# Patient Record
Sex: Female | Born: 1995 | Race: Asian | Hispanic: No | Marital: Single | Smoking: Never smoker
Health system: Southern US, Community
[De-identification: ages and names within clinical notes are randomized; demographics above are authoritative.]

## PROBLEM LIST (undated history)

## (undated) HISTORY — PX: MOUTH SURGERY: SHX715

---
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
## 2019-02-27 ENCOUNTER — Other Ambulatory Visit: Payer: Self-pay

## 2019-02-27 DIAGNOSIS — Z20822 Contact with and (suspected) exposure to covid-19: Secondary | ICD-10-CM

## 2019-03-02 LAB — NOVEL CORONAVIRUS, NAA: SARS-CoV-2, NAA: NOT DETECTED

## 2020-10-17 ENCOUNTER — Emergency Department (HOSPITAL_COMMUNITY)
Admission: EM | Admit: 2020-10-17 | Discharge: 2020-10-17 | Disposition: A | Discharge status: Home / Self Care | Payer: 59 | Attending: Emergency Medicine | Admitting: Emergency Medicine

## 2020-10-17 ENCOUNTER — Other Ambulatory Visit: Payer: Self-pay

## 2020-10-17 DIAGNOSIS — T782XXA Anaphylactic shock, unspecified, initial encounter: Secondary | ICD-10-CM | POA: Diagnosis not present

## 2020-10-17 DIAGNOSIS — R1310 Dysphagia, unspecified: Secondary | ICD-10-CM | POA: Diagnosis present

## 2020-10-17 DIAGNOSIS — T63461A Toxic effect of venom of wasps, accidental (unintentional), initial encounter: Secondary | ICD-10-CM | POA: Diagnosis not present

## 2020-10-17 DIAGNOSIS — R000 Tachycardia, unspecified: Secondary | ICD-10-CM | POA: Insufficient documentation

## 2020-10-17 LAB — I-STAT BETA HCG BLOOD, ED (MC, WL, AP ONLY): I-stat hCG, quantitative: <5 m[IU]/mL (ref <5)

## 2020-10-17 MED ORDER — SODIUM CHLORIDE 0.9 % IV BOLUS
1000.0000 mL | Freq: Once | INTRAVENOUS | Status: AC
  Administered 2020-10-17: 1000 mL via INTRAVENOUS

## 2020-10-17 MED ORDER — EPINEPHRINE 0.3 MG/0.3ML IJ SOAJ
0.3000 mg | Freq: Once | INTRAMUSCULAR | Status: AC
  Administered 2020-10-17: 0.3 mg via INTRAMUSCULAR
  Filled 2020-10-17: qty 0.3

## 2020-10-17 MED ORDER — DIPHENHYDRAMINE HCL 50 MG/ML IJ SOLN
50.0000 mg | Freq: Once | INTRAMUSCULAR | Status: AC
  Administered 2020-10-17: 50 mg via INTRAVENOUS
  Filled 2020-10-17: qty 1

## 2020-10-17 MED ORDER — FAMOTIDINE IN NACL 20-0.9 MG/50ML-% IV SOLN
20.0000 mg | Freq: Once | INTRAVENOUS | Status: AC
  Administered 2020-10-17: 20 mg via INTRAVENOUS
  Filled 2020-10-17: qty 50

## 2020-10-17 MED ORDER — EPINEPHRINE 0.3 MG/0.3ML IJ SOAJ: 0.3000 mg | INTRAMUSCULAR | 0 refills | Status: AC | PRN

## 2020-10-17 MED ORDER — METHYLPREDNISOLONE SODIUM SUCC 125 MG IJ SOLR
125.0000 mg | Freq: Once | INTRAMUSCULAR | Status: AC
  Administered 2020-10-17: 125 mg via INTRAVENOUS
  Filled 2020-10-17: qty 2

## 2020-10-17 MED ORDER — SODIUM CHLORIDE 0.9 % IV SOLN
INTRAVENOUS | Status: DC
Start: 2020-10-17 — End: 2020-10-17

## 2020-10-17 MED ORDER — PREDNISONE 50 MG PO TABS: 50.0000 mg | ORAL_TABLET | Freq: Every day | ORAL | 0 refills | Status: AC

## 2020-10-17 NOTE — ED Notes (Signed)
RN reviewed discharge instructions w/ pt. Follow up and prescriptions reviewed, epi pen instructions reviewed. Pt had no further questions, ambulated independently to lobby

## 2020-10-17 NOTE — ED Provider Notes (Signed)
[facility] EMERGENCY DEPARTMENT Provider Note   CSN: [id number] Arrival date & time: 10/17/20  5784     History Chief Complaint  Patient presents with   Allergic Reaction    Tonya Jimenez is a 25 y.o. female.  The history is provided by the patient and medical records. No language interpreter was used.  Allergic Reaction Presenting symptoms: difficulty swallowing, itching, rash and swelling   Presenting symptoms: no difficulty breathing and no wheezing   Severity:  Severe Duration:  4 hours Context: insect bite/sting   Relieved by:  Nothing Worsened by:  Nothing Ineffective treatments:  None tried     No past medical history on file.  There are no problems to display for this patient.   Past Surgical History:  Procedure Laterality Date   MOUTH SURGERY       OB History   No obstetric history on file.     No family history on file.  Social History   Tobacco Use   Smoking status: Never  Substance Use Topics   Alcohol use: No   Drug use: No    Home Medications Prior to Admission medications   Medication Sig Start Date End Date Taking? Authorizing Provider  cyclobenzaprine (FLEXERIL) 10 MG tablet Take 1 tablet (10 mg total) by mouth 2 (two) times daily as needed for muscle spasms. 09/08/14   Elpidio Anis, PA-C  ibuprofen (ADVIL,MOTRIN) 800 MG tablet Take 1 tablet (800 mg total) by mouth 3 (three) times daily. 09/08/14   Elpidio Anis, PA-C    Allergies    Patient has no known allergies.  Review of Systems   Review of Systems  Constitutional:  Negative for chills, diaphoresis, fatigue and fever.  HENT:  Positive for facial swelling and trouble swallowing. Negative for congestion.   Eyes:  Negative for visual disturbance.  Respiratory:  Positive for chest tightness. Negative for cough, shortness of breath and wheezing.   Cardiovascular:  Positive for chest pain and palpitations. Negative for leg swelling.  Gastrointestinal:   Negative for abdominal pain, constipation, diarrhea, nausea and vomiting.  Genitourinary:  Negative for dysuria.  Musculoskeletal:  Negative for back pain, neck pain and neck stiffness.  Skin:  Positive for itching and rash. Negative for wound.  Neurological:  Positive for light-headedness. Negative for dizziness, weakness and headaches.  Psychiatric/Behavioral:  Negative for agitation and confusion.   All other systems reviewed and are negative.  Physical Exam Updated Vital Signs BP (!) 100/58   Pulse 92   Temp 98.9 F (37.2 C) (Oral)   Resp 18   SpO2 98%   Physical Exam Vitals and nursing note reviewed.  Constitutional:      General: She is not in acute distress.    Appearance: She is well-developed. She is ill-appearing. She is not toxic-appearing or diaphoretic.  HENT:     Head: Atraumatic.     Comments: Diffuse facial swelling including the eyelids bilaterally.  Sensation of throat swelling reported.  No stridor appreciated.    Nose: Nose normal. No congestion or rhinorrhea.     Mouth/Throat:     Mouth: Mucous membranes are moist.     Pharynx: No oropharyngeal exudate or posterior oropharyngeal erythema.  Eyes:     Extraocular Movements: Extraocular movements intact.     Conjunctiva/sclera: Conjunctivae normal.     Pupils: Pupils are equal, round, and reactive to light.  Cardiovascular:     Rate and Rhythm: Regular rhythm. Tachycardia present.     Heart  sounds: No murmur heard. Pulmonary:     Effort: Pulmonary effort is normal. No respiratory distress.     Breath sounds: Normal breath sounds.  Abdominal:     Palpations: Abdomen is soft.     Tenderness: There is no abdominal tenderness. There is no right CVA tenderness, left CVA tenderness, guarding or rebound.  Musculoskeletal:        General: No tenderness.     Cervical back: Neck supple. No tenderness.  Skin:    General: Skin is warm and dry.     Findings: Erythema and rash present.  Neurological:     General:  No focal deficit present.     Mental Status: She is alert.     Sensory: No sensory deficit.    ED Results / Procedures / Treatments   Labs (all labs ordered are listed, but only abnormal results are displayed) Labs Reviewed  I-STAT BETA HCG BLOOD, ED ([facility], [facility], [facility] ONLY)    EKG None  Radiology No results found.  Procedures Procedures   CRITICAL CARE Performed by: Canary Brim Maheen Cwikla Total critical care time: 35 minutes Critical care time was exclusive of separately billable procedures and treating other patients. Critical care was necessary to treat or prevent imminent or life-threatening deterioration. Critical care was time spent personally by me on the following activities: development of treatment plan with patient and/or surrogate as well as nursing, discussions with consultants, evaluation of patient's response to treatment, examination of patient, obtaining history from patient or surrogate, ordering and performing treatments and interventions, ordering and review of laboratory studies, ordering and review of radiographic studies, pulse oximetry and re-evaluation of patient's condition.  Medications Ordered in ED Medications  sodium chloride 0.9 % bolus 1,000 mL (0 mLs Intravenous Stopped 10/17/20 1243)    And  0.9 %  sodium chloride infusion ( Intravenous New Bag/Given 10/17/20 1259)  diphenhydrAMINE (BENADRYL) injection 50 mg (50 mg Intravenous Given 10/17/20 1017)  EPINEPHrine (EPI-PEN) injection 0.3 mg (0.3 mg Intramuscular Given 10/17/20 1018)  methylPREDNISolone sodium succinate (SOLU-MEDROL) 125 mg/2 mL injection 125 mg (125 mg Intravenous Given 10/17/20 1017)  famotidine (PEPCID) IVPB 20 mg premix (0 mg Intravenous Stopped 10/17/20 1242)    ED Course  I have reviewed the triage vital signs and the nursing notes.  Pertinent labs & imaging results that were available during my care of the patient were reviewed by me and considered in my medical decision making (see  chart for details).    MDM Rules/Calculators/A&P                           Tonya Jimenez is a 25 y.o. female with no significant past medical history who presents with anaphylactic reaction.  According to patient, last week she was stung by yellow jacket on her left upper chest.  She had a local reaction but did not have any systemic symptoms.  She reports that this morning, patient saw a blackhead where the sting took place but after trying to pop it and pull out the black area, it was a part of the stinger.  Shortly thereafter, patient started having anaphylactic reaction.  She reports chest tightness, chest pain, throat tightening, diffuse rash, pruritus, and lightheadedness.  She reports that she went to urgent care this morning and was sent immediately here for anaphylactic reaction.  She has never had a reaction like this before.  Denies any other complaints.  Patient seen quickly in triage where  she was determined to have anaphylactic reaction.  She had antihistamines ordered, steroids, and epinephrine for airway protection.  Patient also get some fluids for feeling lightheaded.  I saw patient just before getting the medications and she indeed was having a diffuse anaphylactic reaction.  She had swelling of her face, eyelids, and sensation of her mouth swelling.  I did not appreciate stridor on my auscultation nor significant wheezing.  She had diffuse rash and pruritus.  She was slightly tachycardic and had soft pressures although she was not hypoxic.  She was 9 difficulty breathing at this time.  After epinephrine and steroids and histamines, symptoms drastically improved.  She had no further sensation of abnormality in her mouth or throat and the swelling was improving.  Rash nearly resolved and itching had improved.  Patient be observed for several hours given the severity of her reaction to make sure the epinephrine does not wear off with recurrent reaction.  If she is observed for  several hours and has no worsening of symptoms and tolerates a p.o. challenge, dissipate discharge home with a burst of steroids, instructions to take Benadryl, and an epinephrine prescription.  Patient is amenable this plan, dissipate reassessment after monitoring and p.o. challenge.  Patient felt better after monitoring.  She was discharged home with resolution of symptoms and with prescription for steroids and EpiPen.  Final Clinical Impression(s) / ED Diagnoses Final diagnoses:  None    Rx / DC Orders ED Discharge Orders          Ordered    predniSONE (DELTASONE) 50 MG tablet  Daily        10/17/20 1449    EPINEPHrine 0.3 mg/0.3 mL IJ SOAJ injection  As needed        10/17/20 1449            Clinical Impression: 1. Anaphylaxis, initial encounter   2. Yellow jacket sting, accidental or unintentional, initial encounter     Disposition: Discharge  Condition: Good  I have discussed the results, Dx and Tx plan with the pt(& family if present). He/she/they expressed understanding and agree(s) with the plan. Discharge instructions discussed at great length. Strict return precautions discussed and pt &/or family have verbalized understanding of the instructions. No further questions at time of discharge.    Discharge Medication List as of 10/17/2020  2:49 PM     START taking these medications   Details  EPINEPHrine 0.3 mg/0.3 mL IJ SOAJ injection Inject 0.3 mg into the muscle as needed for anaphylaxis., Starting Sat 10/17/2020, Print    predniSONE (DELTASONE) 50 MG tablet Take 1 tablet (50 mg total) by mouth daily for 5 days., Starting Sun 10/18/2020, Until Fri 10/23/2020, Print        Follow Up: [facility] [address] [place] [address] [phone] Schedule an appointment as soon as possible for a visit    [facility] EMERGENCY DEPARTMENT [address] [id number] m[place] [address] [phone]       Castella Lerner, Canary Brim, MD 10/17/20 [phone]

## 2020-10-17 NOTE — ED Triage Notes (Signed)
Pt stung by yellow jacket last Thursday (7/14) and yesterday (7/22) pt developed throat tightening, itching, rash, and chest pain. Went to UC for eval this morning and was sent here for further eval. Took bendadryl and ASA PTA. Denies previous rxn to being stung.

## 2020-10-17 NOTE — ED Provider Notes (Signed)
Emergency Medicine Provider Triage Evaluation Note  Tonya Jimenez , a 25 y.o. female  was evaluated in triage.  Pt complains of allergic reaction.  Patient states she was stung last week and again the night before last.  Yesterday she developed hives and itching.  Symptoms continued to progress and she developed swelling around the eyes, throat discomfort, and intermittent chest tightness.  She is self medicated with Benadryl, most recently at 545 this morning, approximately 4 hours PTA.  She continues to have itching, swelling, and chest discomfort.  Throat symptoms have resolved.  She does have difficulty swallowing liquids due to her symptoms.  No previous history of allergic reaction.  No other medical problems.  No sob, mouth swelling  Review of Systems  Positive: Itching, swelling, chest discomfort Negative: Mouth swelling, sob  Physical Exam  BP 132/86   Pulse (!) 110   Temp 98.9 F (37.2 C) (Oral)   Resp 18   SpO2 100%  Gen:   Awake, no distress   Resp:  Normal effort. OP clear MSK:   Moves extremities without difficulty  Other:  Diffuse hives noted.  Periorbital swelling bilaterally.  Speaking in full sentences.  Clear lung sounds.  Handling secretions.  Patient is tachycardic at 110.  Medical Decision Making  Medically screening exam initiated at 9:36 AM.  Appropriate orders placed.  Tonya Jimenez was informed that the remainder of the evaluation will be completed by another provider, this initial triage assessment does not replace that evaluation, and the importance of remaining in the ED until their evaluation is complete.  With with allergic reaction affecting 2+ systems. Will tx for anaphylaxis.   Tonya Apley, PA-C 10/17/20 0945    Jimenez, Tonya Brim, MD 10/17/20 [phone]

## 2020-10-17 NOTE — Discharge Instructions (Addendum)
Your history, exam, work-up today are consistent with anaphylactic reaction related to the bee sting.  Please stay away from yellow jackets and please keep the EpiPen with you.  Please take the steroids for the next 5 days starting tomorrow as you received today's dose already.  Please use Benadryl if having more itching.  Please follow-up with your primary doctor.  If any symptoms change or worsen, please return to the nearest emergency department.
